# Patient Record
Sex: Female | Born: 1960 | Race: White | Hispanic: No | Marital: Married | Smoking: Never smoker
Health system: Southern US, Community
[De-identification: ages and names within clinical notes are randomized; demographics above are authoritative.]

## PROBLEM LIST (undated history)

## (undated) DIAGNOSIS — I10 Essential (primary) hypertension: Secondary | ICD-10-CM

## (undated) HISTORY — DX: Essential (primary) hypertension: I10

## (undated) HISTORY — PX: WISDOM TOOTH EXTRACTION: SHX21

---
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
## 1986-12-10 HISTORY — PX: TUBAL LIGATION: SHX77

## 2003-12-11 HISTORY — PX: LAPAROSCOPIC CHOLECYSTECTOMY: SUR755

## 2013-07-02 ENCOUNTER — Other Ambulatory Visit: Payer: Self-pay

## 2013-07-02 DIAGNOSIS — Z1231 Encounter for screening mammogram for malignant neoplasm of breast: Secondary | ICD-10-CM

## 2013-07-28 ENCOUNTER — Ambulatory Visit: Payer: Self-pay

## 2013-07-30 ENCOUNTER — Ambulatory Visit
Admission: RE | Admit: 2013-07-30 | Discharge: 2013-07-30 | Disposition: A | Discharge status: Home / Self Care | Payer: BC Managed Care – PPO | Source: Ambulatory Visit

## 2013-07-30 DIAGNOSIS — Z1231 Encounter for screening mammogram for malignant neoplasm of breast: Secondary | ICD-10-CM

## 2013-12-25 ENCOUNTER — Ambulatory Visit (AMBULATORY_SURGERY_CENTER): Payer: Self-pay

## 2013-12-25 VITALS — Ht 63.0 in | Wt 220.0 lb

## 2013-12-25 DIAGNOSIS — K219 Gastro-esophageal reflux disease without esophagitis: Secondary | ICD-10-CM

## 2013-12-25 DIAGNOSIS — Z1211 Encounter for screening for malignant neoplasm of colon: Secondary | ICD-10-CM

## 2013-12-25 MED ORDER — MOVIPREP 100 G PO SOLR: 1.0000 | Freq: Once | ORAL | Status: DC

## 2013-12-29 ENCOUNTER — Encounter: Payer: Self-pay | Admitting: Gastroenterology

## 2013-12-31 ENCOUNTER — Ambulatory Visit (AMBULATORY_SURGERY_CENTER): Payer: BC Managed Care – PPO | Admitting: Gastroenterology

## 2013-12-31 ENCOUNTER — Encounter: Payer: Self-pay | Admitting: Gastroenterology

## 2013-12-31 VITALS — BP 121/80 | HR 58 | Temp 97.4°F | Resp 20 | Ht 63.0 in | Wt 220.0 lb

## 2013-12-31 DIAGNOSIS — Z1211 Encounter for screening for malignant neoplasm of colon: Secondary | ICD-10-CM

## 2013-12-31 DIAGNOSIS — D131 Benign neoplasm of stomach: Secondary | ICD-10-CM

## 2013-12-31 DIAGNOSIS — D126 Benign neoplasm of colon, unspecified: Secondary | ICD-10-CM

## 2013-12-31 DIAGNOSIS — R1319 Other dysphagia: Secondary | ICD-10-CM

## 2013-12-31 DIAGNOSIS — K219 Gastro-esophageal reflux disease without esophagitis: Secondary | ICD-10-CM

## 2013-12-31 MED ORDER — SODIUM CHLORIDE 0.9 % IV SOLN: 500.0000 mL | INTRAVENOUS | Status: DC

## 2013-12-31 NOTE — Op Note (Signed)
[place]  [address] [place], [id number]   ENDOSCOPY PROCEDURE REPORT  PATIENT: Katie, Miles  MR#: [id number] BIRTHDATE: September 02, 1961 , 52  yrs. old GENDER: Female ENDOSCOPIST: Ladene Artist, MD, [facility] REFERRED BY:  Nelda Bucks, M.D. PROCEDURE DATE:  12/31/2013 PROCEDURE:  EGD w/ biopsy and Savary dilation of esophagus ASA CLASS:     Class II INDICATIONS:  History of esophageal reflux.   Dysphagia. MEDICATIONS: There was residual sedation effect present from prior procedure, MAC sedation, administered by CRNA, and propofol (Diprivan) 100mg  IV TOPICAL ANESTHETIC: none DESCRIPTION OF PROCEDURE: After the risks benefits and alternatives of the procedure were thoroughly explained, informed consent was obtained.  The LB [id number] endoscope was introduced through the mouth and advanced to the second portion of the duodenum. Without limitations.  The instrument was slowly withdrawn as the mucosa was fully examined.  ESOPHAGUS: The mucosa of the esophagus appeared normal. STOMACH: Five sessile polyps ranging between 3-54mm in size were found in the gastric body and gastric fundus.  Multiple biopsies was performed of the 3 larger polyps.   The stomach otherwise appeared normal. DUODENUM: The duodenal mucosa showed no abnormalities in the bulb and second portion of the duodenum.  Retroflexed views revealed no abnormalities.   A guidewire as placed and the scope was then withdrawn from the patient. 17 mm Savary dilator passed without resistance or heme. The dilator and guidewire were removed and the procedure completed.  COMPLICATIONS: There were no complications.  ENDOSCOPIC IMPRESSION: 1.   Five sessile polyps ranging between 3-5 mm in the gastric body and gastric fundus; biopsies 2.   The EGD otherwise appeared normal  RECOMMENDATIONS: 1.  Anti-reflux regimen 2.  Await pathology results 3.  Post dilation instructions 4.  Continue  PPI  eSigned:  Ladene Artist, MD, [facility] 12/31/2013 2:08 PM

## 2013-12-31 NOTE — Progress Notes (Signed)
Patient did not experience any of the following events: a burn prior to discharge; a fall within the facility; wrong site/side/patient/procedure/implant event; or a hospital transfer or hospital admission upon discharge from the facility. (G8907)Patient did not have preoperative order for IV antibiotic SSI prophylaxis. (G8918) ewm 

## 2013-12-31 NOTE — Progress Notes (Signed)
Lidocaine-40mg IV prior to Propofol InductionPropofol given over incremental dosages 

## 2013-12-31 NOTE — Progress Notes (Signed)
Called to room to assist during endoscopic procedure.  Patient ID and intended procedure confirmed with present staff. Received instructions for my participation in the procedure from the performing physician.  

## 2013-12-31 NOTE — Patient Instructions (Signed)
YOU HAD AN ENDOSCOPIC PROCEDURE TODAY AT [facility]: Refer to the procedure report that was given to you for any specific questions about what was found during the examination.  If the procedure report does not answer your questions, please call your gastroenterologist to clarify.  If you requested that your care partner not be given the details of your procedure findings, then the procedure report has been included in a sealed envelope for you to review at your convenience later.  YOU SHOULD EXPECT: Some feelings of bloating in the abdomen. Passage of more gas than usual.  Walking can help get rid of the air that was put into your GI tract during the procedure and reduce the bloating. If you had a lower endoscopy (such as a colonoscopy or flexible sigmoidoscopy) you may notice spotting of blood in your stool or on the toilet paper. If you underwent a bowel prep for your procedure, then you may not have a normal bowel movement for a few days.  Diet. .  Drink plenty of fluids but you should avoid alcoholic beverages for 24 hours. Please follow the dilation diet today as directed. You may resume your regular diet tomorrow morning Friday 01-01-2014.   ACTIVITY: Your care partner should take you home directly after the procedure.  You should plan to take it easy, moving slowly for the rest of the day.  You can resume normal activity the day after the procedure however you should NOT DRIVE or use heavy machinery for 24 hours (because of the sedation medicines used during the test).    SYMPTOMS TO REPORT IMMEDIATELY: A gastroenterologist can be reached at any hour.  During normal business hours, 8:30 AM to 5:00 PM Monday through Friday, call [phone].  After hours and on weekends, please call the GI answering service at [phone] who will take a message and have the physician on call contact you.   Following lower endoscopy (colonoscopy or flexible sigmoidoscopy):  Excessive  amounts of blood in the stool  Significant tenderness or worsening of abdominal pains  Swelling of the abdomen that is new, acute  Fever of 100F or higher  Following upper endoscopy (EGD)  Vomiting of blood or coffee ground material  New chest pain or pain under the shoulder blades  Painful or persistently difficult swallowing  New shortness of breath  Fever of 100F or higher  Black, tarry-looking stools  FOLLOW UP: If any biopsies were taken you will be contacted by phone or by letter within the next 1-3 weeks.  Call your gastroenterologist if you have not heard about the biopsies in 3 weeks.  Our staff will call the home number listed on your records the next business day following your procedure to check on you and address any questions or concerns that you may have at that time regarding the information given to you following your procedure. This is a courtesy call and so if there is no answer at the home number and we have not heard from you through the emergency physician on call, we will assume that you have returned to your regular daily activities without incident.  SIGNATURES/CONFIDENTIALITY: You and/or your care partner have signed paperwork which will be entered into your electronic medical record.  These signatures attest to the fact that that the information above on your After Visit Summary has been reviewed and is understood.  Full responsibility of the confidentiality of this discharge information lies with you and/or your care-partner.  Handout on  polyps, dilation diet ,reflux,hemorrhoids Continue your PPI daily as directed. Please hold aspirin, all aspirin products and all anti inflammatory medicines like motrin, aleve, advil, goody's for 2 weeks. You may use tylenol only for pain the next 2 weeks. You may resume the other medicines 01-14-2014 Thursday.

## 2013-12-31 NOTE — Op Note (Signed)
[place]  [address] [place], [id number]   COLONOSCOPY PROCEDURE REPORT  PATIENT: Katie Miles, Katie Miles  MR#: [id number] BIRTHDATE: January 06, 1961 , 52  yrs. old GENDER: Female ENDOSCOPIST: Ladene Artist, MD, [facility] REFERRED [id number] Delena Bali, M.D. PROCEDURE DATE:  12/31/2013 PROCEDURE:   Colonoscopy with snare polypectomy First Screening Colonoscopy - Avg.  risk and is 50 yrs.  old or older Yes.  Prior Negative Screening - Now for repeat screening. N/A  History of Adenoma - Now for follow-up colonoscopy & has been > or = to 3 yrs.  N/A  Polyps Removed Today? Yes. ASA CLASS:   Class II INDICATIONS:average risk screening. MEDICATIONS: MAC sedation, administered by CRNA and propofol (Diprivan) 350mg  IV DESCRIPTION OF PROCEDURE:   After the risks benefits and alternatives of the procedure were thoroughly explained, informed consent was obtained.  A digital rectal exam revealed no abnormalities of the rectum.   The LB [id number]  endoscope was introduced through the anus and advanced to the cecum, which was identified by both the appendix and ileocecal valve. No adverse events experienced.   The quality of the prep was good, using MoviPrep  The instrument was then slowly withdrawn as the colon was fully examined.  COLON FINDINGS: A sessile polyp measuring 5 mm in size was found in the ascending colon.  A polypectomy was performed with a cold snare.  The resection was complete and the polyp tissue was completely retrieved.  A sessile polyp measuring 5 mm in size was found in the proximal transverse colon. A polypectomy was performed with a cold snare. The resection was complete and the polyp tissue was completely retrieved.  A sessile polyp measuring 7 mm in size was found in the distal transverse colon.  A polypectomy was performed using snare cautery. The resection was complete and the polyp tissue was completely retrieved.  A sessile polyp measuring 5 mm in  size was found in the descending colon.  A polypectomy was performed with a cold snare.  The resection was complete and the polyp tissue was completely retrieved.  A sessile polyp measuring 5 mm in size was found in the sigmoid colon.  A polypectomy was performed with a cold snare. The resection was complete and the polyp tissue was completely retrieved.  The colon was otherwise normal. There was no diverticulosis, inflammation, polyps or cancers unless previously stated. Retroflexed views revealed small internal/external hemorrhoids. The time to cecum=2 minutes 30 seconds.  Withdrawal time=19 minutes 00 seconds.  The scope was withdrawn and the procedure completed. COMPLICATIONS: There were no complications.  ENDOSCOPIC IMPRESSION: 1.   Sessile polyp measuring 5 mm in the ascending colon; polypectomy performed with a cold snare 2.   Sessile polyp measuring 5 mm in the proximal transverse colon; polypectomy performed with a cold snare 3.   Sessile polyp measuring 7 mm in the distal transverse colon; polypectomy performed using snare cautery 4.   Sessile polyp measuring 5 mm in the descending colon; polypectomy performed with a cold snare 5.   Sessile polyp measuring 5 mm in the sigmoid colon; polypectomy performed with a cold snare 6.   Small internal/external hemorrhoids  RECOMMENDATIONS: 1.  Hold aspirin, aspirin products, and anti-inflammatory medication for 2 weeks. 2.  Await pathology results 3.  Repeat colonoscopy in 3 years if 3 or more polyps adenomatous; 5 years if 1-2 adenomatous: otherwise 10 years   eSigned:  Ladene Artist, MD, [facility] 12/31/2013 2:04 PM     PATIENT NAME:  Katie Miles, Katie Miles MR#: [id number]

## 2014-01-01 ENCOUNTER — Telehealth: Payer: Self-pay | Admitting: *Deleted

## 2014-01-01 NOTE — Telephone Encounter (Signed)
  Follow up Call-  Call back number 12/31/2013  Post procedure Call Back phone  # [phone]  Permission to leave phone message Yes     Patient questions:  Do you have a fever, pain , or abdominal swelling? no Pain Score  0 *  Have you tolerated food without any problems? yes  Have you been able to return to your normal activities? yes  Do you have any questions about your discharge instructions: Diet   no Medications  no Follow up visit  no  Do you have questions or concerns about your Care? no  Actions: * If pain score is 4 or above: No action needed, pain <4.

## 2014-01-05 ENCOUNTER — Encounter: Payer: Self-pay | Admitting: Gastroenterology

## 2014-01-13 NOTE — Addendum Note (Signed)
Addended by: Lowry Ram on: 01/13/2014 11:00 AM   Modules accepted: Level of Service

## 2014-09-09 ENCOUNTER — Other Ambulatory Visit: Payer: Self-pay

## 2014-09-09 DIAGNOSIS — Z1231 Encounter for screening mammogram for malignant neoplasm of breast: Secondary | ICD-10-CM

## 2014-09-23 ENCOUNTER — Ambulatory Visit
Admission: RE | Admit: 2014-09-23 | Discharge: 2014-09-23 | Disposition: A | Discharge status: Home / Self Care | Payer: BC Managed Care – PPO | Source: Ambulatory Visit

## 2014-09-23 DIAGNOSIS — Z1231 Encounter for screening mammogram for malignant neoplasm of breast: Secondary | ICD-10-CM

## 2015-11-24 ENCOUNTER — Other Ambulatory Visit: Payer: Self-pay

## 2015-11-24 DIAGNOSIS — Z1231 Encounter for screening mammogram for malignant neoplasm of breast: Secondary | ICD-10-CM

## 2015-12-07 ENCOUNTER — Ambulatory Visit
Admission: RE | Admit: 2015-12-07 | Discharge: 2015-12-07 | Disposition: A | Discharge status: Home / Self Care | Payer: No Typology Code available for payment source | Source: Ambulatory Visit

## 2015-12-07 DIAGNOSIS — Z1231 Encounter for screening mammogram for malignant neoplasm of breast: Secondary | ICD-10-CM

## 2016-10-30 ENCOUNTER — Encounter: Payer: Self-pay | Admitting: Gastroenterology

## 2016-11-04 IMAGING — MG MM SCREENING BREAST TOMO BILATERAL
8 series · 8 of 24 positions shown · non-contrast
Comparison: Previous exam(s).

CLINICAL DATA: Screening.

EXAM:
DIGITAL SCREENING BILATERAL MAMMOGRAM WITH 3D TOMO WITH CAD

[R CC]
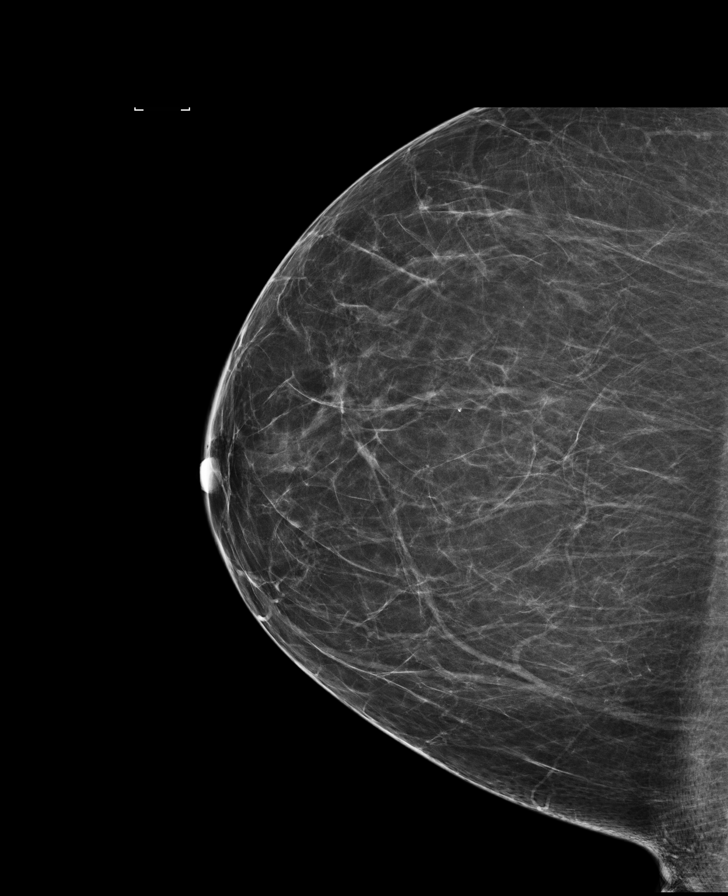

[R MLO]
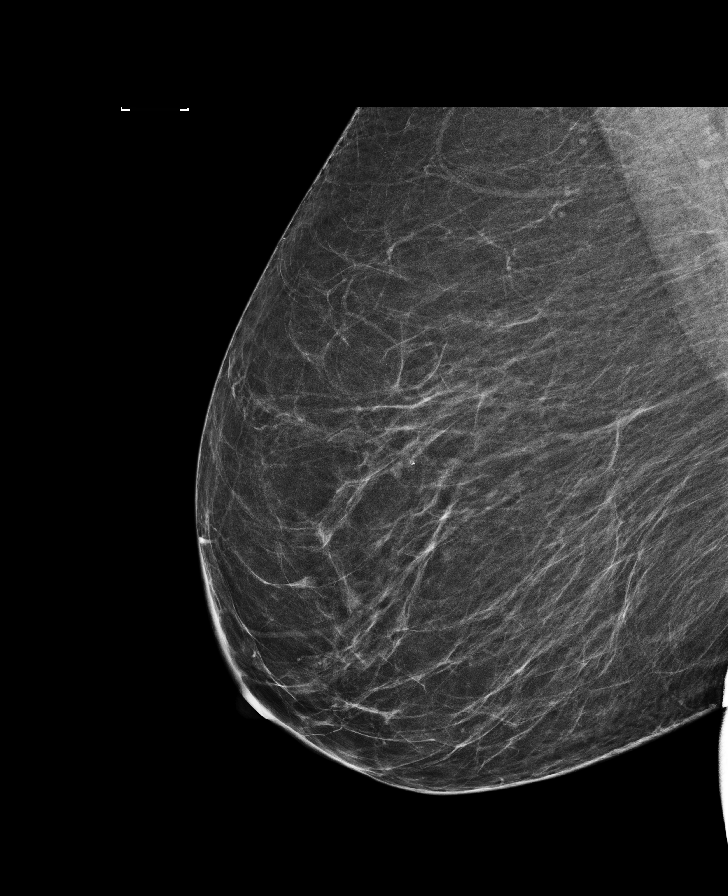

[L MLO]
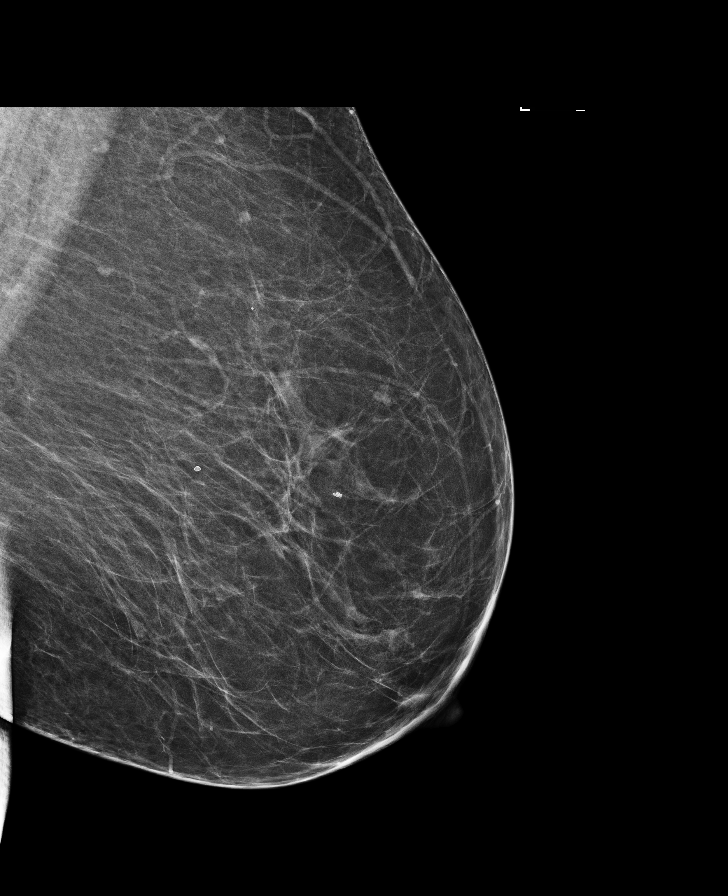

[L CC]
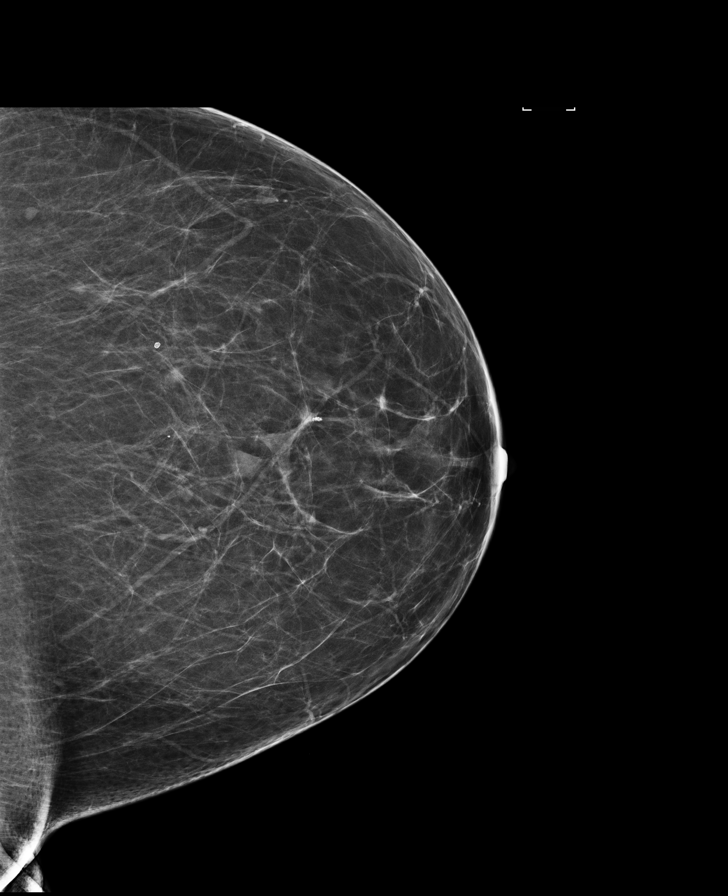

[R MLO tomo · tomo slice 40/79.0]
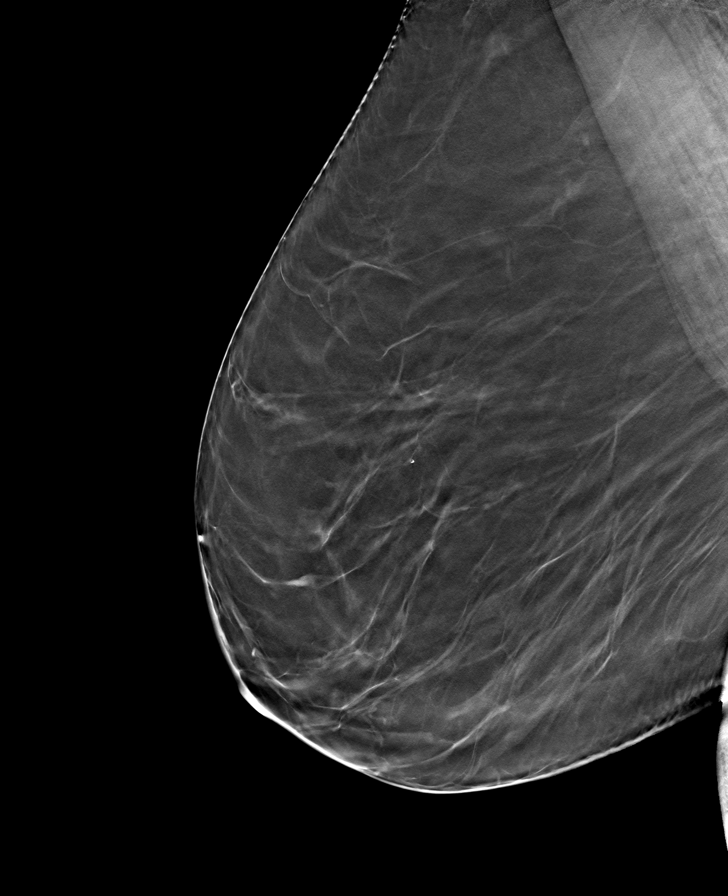

[L MLO tomo · tomo slice 41/81.0]
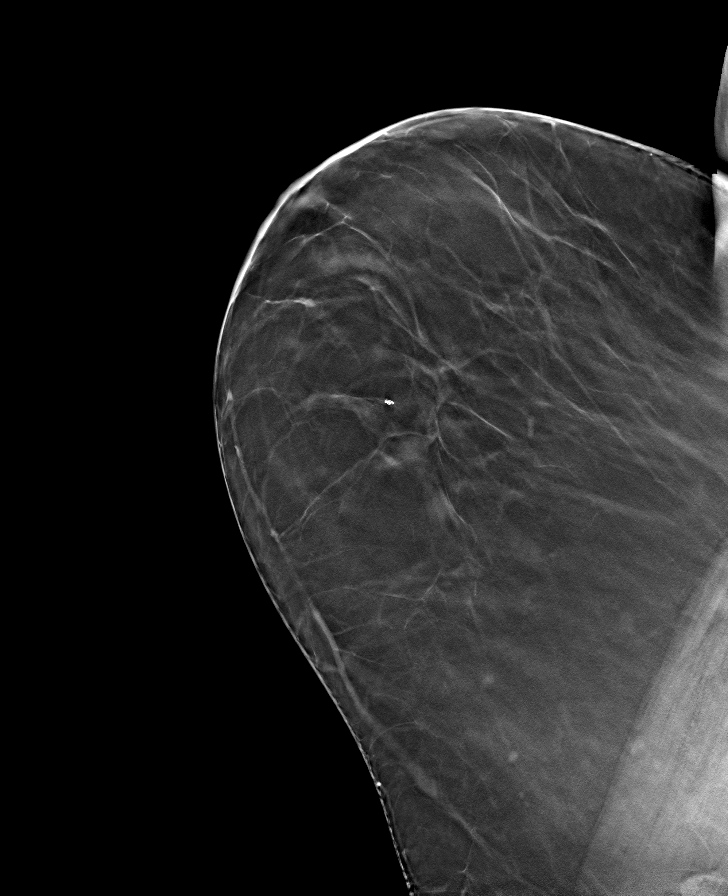

[R CC tomo · tomo slice 39/76.0]
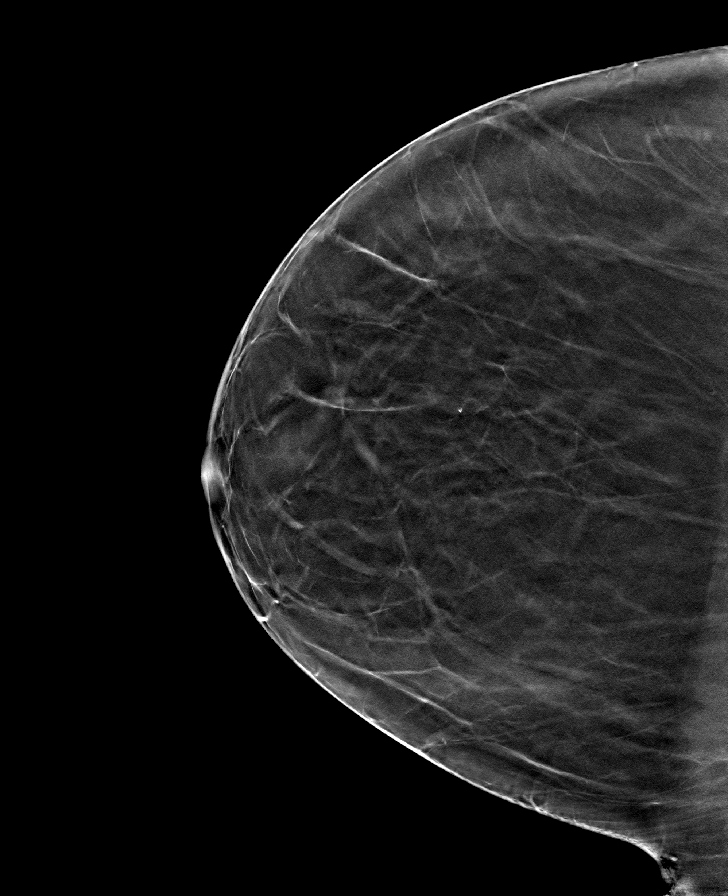

[L CC tomo · tomo slice 39/77.0]
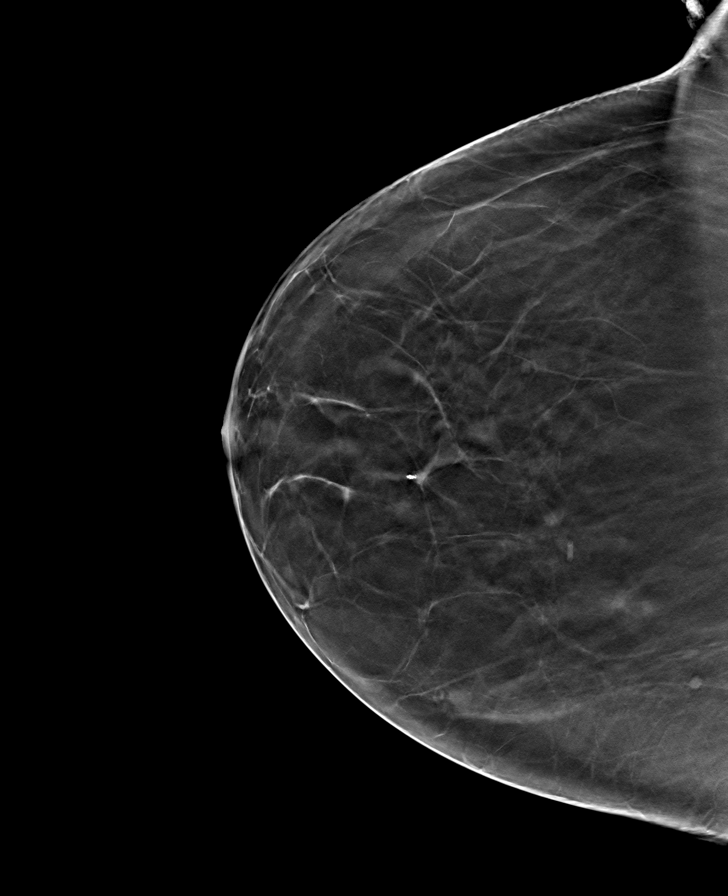

[8 of 24 positions shown; findings below may reference images not displayed]

ACR Breast Density Category b: There are scattered areas of
fibroglandular density.
FINDINGS: There are no findings suspicious for malignancy. Images were
processed with CAD.
IMPRESSION: No mammographic evidence of malignancy. A result letter of this
screening mammogram will be mailed directly to the patient.

RECOMMENDATION:
Screening mammogram in one year. (Code:55-L-23V)

BI-RADS CATEGORY  1: Negative.

## 2024-06-25 ENCOUNTER — Encounter: Payer: Self-pay | Admitting: Gastroenterology

## 2024-07-27 ENCOUNTER — Telehealth: Payer: Self-pay

## 2024-07-27 ENCOUNTER — Ambulatory Visit (AMBULATORY_SURGERY_CENTER)

## 2024-07-27 VITALS — Ht 63.0 in | Wt 210.0 lb

## 2024-07-27 DIAGNOSIS — Z860100 Personal history of colon polyps, unspecified: Secondary | ICD-10-CM

## 2024-07-27 DIAGNOSIS — R1310 Dysphagia, unspecified: Secondary | ICD-10-CM

## 2024-07-27 MED ORDER — NA SULFATE-K SULFATE-MG SULF 17.5-3.13-1.6 GM/177ML PO SOLN: 1.0000 | Freq: Once | ORAL | 0 refills | Status: AC

## 2024-07-27 NOTE — Telephone Encounter (Signed)
 Dr. Wilhelmenia,  This is a previous pt of Dr. Aneita. She wanted to see if it is possible to have an EGD the day of her colonoscopy. She has had EGD in the past with dilation and is reporting some difficulties with swallowing occasionally. Please let me know if you're ok to proceed with this.   Thank you

## 2024-07-27 NOTE — Telephone Encounter (Signed)
 This is very reasonable. EGD for issues of dysphagia with possible dilation. Thanks. GM

## 2024-07-27 NOTE — Addendum Note (Signed)
 Addended by: ANN DERRAL CROME on: 07/27/2024 09:05 AM   Modules accepted: Orders

## 2024-07-27 NOTE — Telephone Encounter (Signed)
 EGD added Ref has been updated pt made aware

## 2024-07-27 NOTE — Progress Notes (Signed)
 No egg or soy allergy known to patient  No issues known to pt with past sedation with any surgeries or procedures Patient denies ever being told they had issues or difficulty with intubation  No FH of Malignant Hyperthermia Pt is not on diet pills Pt is not on  home 02  Pt is not on blood thinners  Pt denies issues with constipation  No A fib or A flutter Have any cardiac testing pending-- no  LOA: independent  Prep: suprep   Patient's chart reviewed by Norleen Schillings CNRA prior to previsit and patient appropriate for the [facility].  Previsit completed and red dot placed by patient's name on their procedure day (on provider's schedule).     PV completed with patient. Prep instructions sent via to home address

## 2024-08-14 ENCOUNTER — Encounter: Admitting: Gastroenterology

## 2024-09-15 ENCOUNTER — Encounter: Payer: Self-pay | Admitting: Gastroenterology

## 2024-09-16 ENCOUNTER — Ambulatory Visit (AMBULATORY_SURGERY_CENTER): Admitting: Gastroenterology

## 2024-09-16 ENCOUNTER — Encounter: Payer: Self-pay | Admitting: Gastroenterology

## 2024-09-16 VITALS — BP 123/63 | HR 62 | Temp 97.5°F | Resp 17 | Ht 63.0 in | Wt 210.0 lb

## 2024-09-16 DIAGNOSIS — K5730 Diverticulosis of large intestine without perforation or abscess without bleeding: Secondary | ICD-10-CM | POA: Diagnosis not present

## 2024-09-16 DIAGNOSIS — Z860101 Personal history of adenomatous and serrated colon polyps: Secondary | ICD-10-CM | POA: Diagnosis not present

## 2024-09-16 DIAGNOSIS — D120 Benign neoplasm of cecum: Secondary | ICD-10-CM

## 2024-09-16 DIAGNOSIS — Z1211 Encounter for screening for malignant neoplasm of colon: Secondary | ICD-10-CM | POA: Diagnosis present

## 2024-09-16 DIAGNOSIS — K641 Second degree hemorrhoids: Secondary | ICD-10-CM | POA: Diagnosis not present

## 2024-09-16 DIAGNOSIS — Z860100 Personal history of colon polyps, unspecified: Secondary | ICD-10-CM

## 2024-09-16 DIAGNOSIS — K644 Residual hemorrhoidal skin tags: Secondary | ICD-10-CM

## 2024-09-16 DIAGNOSIS — K2970 Gastritis, unspecified, without bleeding: Secondary | ICD-10-CM

## 2024-09-16 DIAGNOSIS — K317 Polyp of stomach and duodenum: Secondary | ICD-10-CM

## 2024-09-16 DIAGNOSIS — D122 Benign neoplasm of ascending colon: Secondary | ICD-10-CM | POA: Diagnosis not present

## 2024-09-16 DIAGNOSIS — K449 Diaphragmatic hernia without obstruction or gangrene: Secondary | ICD-10-CM | POA: Diagnosis not present

## 2024-09-16 DIAGNOSIS — R1310 Dysphagia, unspecified: Secondary | ICD-10-CM | POA: Diagnosis not present

## 2024-09-16 MED ORDER — SODIUM CHLORIDE 0.9 % IV SOLN: 500.0000 mL | Freq: Once | INTRAVENOUS | Status: DC

## 2024-09-16 MED ORDER — SUCRALFATE 1 GM/10ML PO SUSP: 1.0000 g | Freq: Two times a day (BID) | ORAL | 0 refills | Status: AC

## 2024-09-16 NOTE — Op Note (Signed)
  [facility] Patient Name: Katie Miles Procedure Date: 09/16/2024 7:29 AM MRN: [id number] Endoscopist: [name] , MD, [id number] Age: 63 Referring MD:  Date of Birth: October 24, 1961 Gender: Female Account #: [id number] Procedure:                Upper GI endoscopy Indications:              Dysphagia, Heartburn Medicines:                Monitored Anesthesia Care Procedure:                Pre-Anesthesia Assessment:                           - Prior to the procedure, a History and Physical                            was performed, and patient medications and                            allergies were reviewed. The patient's tolerance of                            previous anesthesia was also reviewed. The risks                            and benefits of the procedure and the sedation                            options and risks were discussed with the patient.                            All questions were answered, and informed consent                            was obtained. Prior Anticoagulants: The patient has                            taken no anticoagulant or antiplatelet agents. ASA                            Grade Assessment: II - A patient with mild systemic                            disease. After reviewing the risks and benefits,                            the patient was deemed in satisfactory condition to                            undergo the procedure.                           After obtaining informed consent, the endoscope was  passed under direct vision. Throughout the                            procedure, the patient's blood pressure, pulse, and                            oxygen saturations were monitored continuously. The                            GIF [id number] #[id number] was introduced through the                            mouth, and advanced to the second part of duodenum.                            The upper GI endoscopy  was accomplished without                            difficulty. The patient tolerated the procedure. Scope In: Scope Out: Findings:                 No gross lesions were noted in the entire                            esophagus. Biopsies were taken with a cold forceps                            for histology. After the rest of the EGD was                            completed, a guidewire was placed and the scope was                            withdrawn. Dilation was performed with a Savary                            dilator with mild resistance at 18 mm. The dilation                            site was examined following endoscope reinsertion                            and showed no change.                           The Z-line was regular and was found 34 cm from the                            incisors.                           A 2 cm hiatal hernia was present.  Multiple small semi-sessile polyps with no bleeding                            and no stigmata of recent bleeding were found in                            the cardia, in the gastric fundus and in the                            gastric body (fundic gland and hyperplastic in                            appearance).                           One 13 mm semi-sessile polyp with bleeding and                            stigmata of recent bleeding was found in the                            gastric antrum. The polyp was removed with a cold                            snare. Resection and retrieval were complete.                           Patchy moderate inflammation characterized by                            erosions and erythema was found in the entire                            examined stomach. Biopsies were taken with a cold                            forceps for histology and Helicobacter pylori                            testing.                           No gross lesions were noted in the duodenal bulb,                             in the first portion of the duodenum and in the                            second portion of the duodenum. Complications:            No immediate complications. Estimated Blood Loss:     Estimated blood loss was minimal. Impression:               - No gross lesions in the entire esophagus.  Biopsied. Dilated to 18 mm Savary.                           - Z-line regular, 34 cm from the incisors.                           - 2 cm hiatal hernia.                           - Multiple gastric polyps - fundic gland and                            hyperplastic in appearance in proximal stomach.                           - One gastric polyp in antrum. Resected and                            retrieved.                           - Erosive gastritis. Biopsied.                           - No gross lesions in the duodenal bulb, in the                            first portion of the duodenum and in the second                            portion of the duodenum. Recommendation:           - Proceed to scheduled colonoscopy.                           - Continue PPI BID.                           - Carafate 1 g twice daily for 2-weeks.                           - Minimize NSAIDs if using.                           - Continue present medications.                           - Await pathology results.                           - Repeat upper endoscopy for surveillance based on                            pathology results (if pathology shows adenomatous                            tissue then  will need 1-year followup).                           - If dysphagia symptoms persist consider esophageal                            manometry.                           - The findings and recommendations were discussed                            with the patient.                           - The findings and recommendations were discussed                            with the  patient's family. [name], MD 09/16/2024 8:50:19 AM

## 2024-09-16 NOTE — Progress Notes (Signed)
Pt. states no medical or surgical changes since previsit or office visit. 

## 2024-09-16 NOTE — Patient Instructions (Addendum)
 Educational handout provided to patient related to Hemorrhoids, Polyps, and Diverticulosis, high fiber diet  High Fiber  diet  Continue present medications  Awaiting pathology results  YOU HAD AN ENDOSCOPIC PROCEDURE TODAY AT THE [facility]:   Refer to the procedure report that was given to you for any specific questions about what was found during the examination.  If the procedure report does not answer your questions, please call your gastroenterologist to clarify.  If you requested that your care partner not be given the details of your procedure findings, then the procedure report has been included in a sealed envelope for you to review at your convenience later.  YOU SHOULD EXPECT: Some feelings of bloating in the abdomen. Passage of more gas than usual.  Walking can help get rid of the air that was put into your GI tract during the procedure and reduce the bloating. If you had a lower endoscopy (such as a colonoscopy or flexible sigmoidoscopy) you may notice spotting of blood in your stool or on the toilet paper. If you underwent a bowel prep for your procedure, you may not have a normal bowel movement for a few days.  Please Note:  You might notice some irritation and congestion in your nose or some drainage.  This is from the oxygen used during your procedure.  There is no need for concern and it should clear up in a day or so.  SYMPTOMS TO REPORT IMMEDIATELY:  Following lower endoscopy (colonoscopy or flexible sigmoidoscopy):  Excessive amounts of blood in the stool  Significant tenderness or worsening of abdominal pains  Swelling of the abdomen that is new, acute  Fever of 100F or higher  Following upper endoscopy (EGD)  Vomiting of blood or coffee ground material  New chest pain or pain under the shoulder blades  Painful or persistently difficult swallowing  New shortness of breath  Fever of 100F or higher  Black, tarry-looking stools  For urgent or emergent  issues, a gastroenterologist can be reached at any hour by calling [phone]. Do not use MyChart messaging for urgent concerns.    DIET:  We do recommend a small meal at first, but then you may proceed to your regular diet.  Drink plenty of fluids but you should avoid alcoholic beverages for 24 hours.  ACTIVITY:  You should plan to take it easy for the rest of today and you should NOT DRIVE or use heavy machinery until tomorrow (because of the sedation medicines used during the test).    FOLLOW UP: Our staff will call the number listed on your records the next business day following your procedure.  We will call around 7:15- 8:00 am to check on you and address any questions or concerns that you may have regarding the information given to you following your procedure. If we do not reach you, we will leave a message.     If any biopsies were taken you will be contacted by phone or by letter within the next 1-3 weeks.  Please call us  at [phone] if you have not heard about the biopsies in 3 weeks.    SIGNATURES/CONFIDENTIALITY: You and/or your care partner have signed paperwork which will be entered into your electronic medical record.  These signatures attest to the fact that that the information above on your After Visit Summary has been reviewed and is understood.  Full responsibility of the confidentiality of this discharge information lies with you and/or your care-partner.

## 2024-09-16 NOTE — Progress Notes (Signed)
 GASTROENTEROLOGY PROCEDURE H&P NOTE   Primary Care Physician: Keren Vicenta BRAVO, MD  HPI: Katie Miles is a 63 y.o. female who presents for EGD/Colonoscopy for evaluation of dysphagia and prior colon polyps.  Past Medical History:  Diagnosis Date   Hypertension    Past Surgical History:  Procedure Laterality Date   KNEE ARTHROSCOPY  1979   LAPAROSCOPIC CHOLECYSTECTOMY  2005   TUBAL LIGATION  1988   WISDOM TOOTH EXTRACTION     Current Outpatient Medications  Medication Sig Dispense Refill   escitalopram (LEXAPRO) 10 MG tablet Take 10 mg by mouth daily.     esomeprazole (NEXIUM) 40 MG capsule Take 40 mg by mouth daily at 12 noon. (Patient taking differently: Take 40 mg by mouth 2 (two) times daily.)     lisinopril-hydrochlorothiazide (ZESTORETIC) 20-25 MG tablet Take 1 tablet by mouth daily.     EPINEPHrine 0.3 mg/0.3 mL IJ SOAJ injection Inject into the muscle as directed.     Inulin (FIBER CHOICE PO) Take 2 tablets by mouth.     Current Facility-Administered Medications  Medication Dose Route Frequency Provider Last Rate Last Admin   0.9 %  sodium chloride  infusion  500 mL Intravenous Once Mansouraty, Alexzandria Massman Jr., MD        Current Outpatient Medications:    escitalopram (LEXAPRO) 10 MG tablet, Take 10 mg by mouth daily., Disp: , Rfl:    esomeprazole (NEXIUM) 40 MG capsule, Take 40 mg by mouth daily at 12 noon. (Patient taking differently: Take 40 mg by mouth 2 (two) times daily.), Disp: , Rfl:    lisinopril-hydrochlorothiazide (ZESTORETIC) 20-25 MG tablet, Take 1 tablet by mouth daily., Disp: , Rfl:    EPINEPHrine 0.3 mg/0.3 mL IJ SOAJ injection, Inject into the muscle as directed., Disp: , Rfl:    Inulin (FIBER CHOICE PO), Take 2 tablets by mouth., Disp: , Rfl:   Current Facility-Administered Medications:    0.9 %  sodium chloride  infusion, 500 mL, Intravenous, Once, Mansouraty, Aloha Raddle., MD Allergies  Allergen Reactions   Shellfish Allergy Shortness Of  Breath    Stated, makes me feel like I'm smothering   Fluoride Preparations Other (See Comments)    High doses cause tongue to crack   Keflex [Cephalexin] Hives   Family History  Problem Relation Age of Onset   Colon cancer Neg Hx    Pancreatic cancer Neg Hx    Rectal cancer Neg Hx    Stomach cancer Neg Hx    Esophageal cancer Neg Hx    Social History   Socioeconomic History   Marital status: Married    Spouse name: Not on file   Number of children: Not on file   Years of education: Not on file   Highest education level: Not on file  Occupational History   Not on file  Tobacco Use   Smoking status: Never   Smokeless tobacco: Never  Substance and Sexual Activity   Alcohol use: No   Drug use: No   Sexual activity: Not on file  Other Topics Concern   Not on file  Social History Narrative   Not on file   Social Drivers of Health   Financial Resource Strain: Not on file  Food Insecurity: Not on file  Transportation Needs: Not on file  Physical Activity: Not on file  Stress: Not on file  Social Connections: Not on file  Intimate Partner Violence: Not on file    Physical Exam: Today's Vitals   09/16/24 9281  BP: 107/70  Pulse: 70  Temp: (!) 97.5 F (36.4 C)  TempSrc: Skin  SpO2: 97%  Weight: 210 lb (95.3 kg)  Height: 5' 3 (1.6 m)   Body mass index is 37.2 kg/m. GEN: NAD EYE: Sclerae anicteric ENT: MMM CV: Non-tachycardic GI: Soft, NT/ND NEURO:  Alert & Oriented x 3  Lab Results: No results for input(s): WBC, HGB, HCT, PLT in the last 72 hours. BMET No results for input(s): NA, K, CL, CO2, GLUCOSE, BUN, CREATININE, CALCIUM in the last 72 hours. LFT No results for input(s): PROT, ALBUMIN, AST, ALT, ALKPHOS, BILITOT, BILIDIR, IBILI in the last 72 hours. PT/INR No results for input(s): LABPROT, INR in the last 72 hours.   Impression / Plan: This is a 63 y.o.female who presents for EGD/Colonoscopy for  evaluation of dysphagia and prior colon polyps.  The risks and benefits of endoscopic evaluation/treatment were discussed with the patient and/or family; these include but are not limited to the risk of perforation, infection, bleeding, missed lesions, lack of diagnosis, severe illness requiring hospitalization, as well as anesthesia and sedation related illnesses.  The patient's history has been reviewed, patient examined, no change in status, and deemed stable for procedure.  The patient and/or family is agreeable to proceed.    [name], MD [facility] Office # [phone]

## 2024-09-16 NOTE — Progress Notes (Signed)
 Called to room to assist during endoscopic procedure.  Patient ID and intended procedure confirmed with present staff. Received instructions for my participation in the procedure from the performing physician.

## 2024-09-16 NOTE — Op Note (Signed)
 [facility] Patient Name: Katie Miles Procedure Date: 09/16/2024 7:28 AM MRN: [id number] Endoscopist: [name] , MD, [id number] Age: 63 Referring MD:  Date of Birth: 11-10-1961 Gender: Female Account #: [id number] Procedure:                Colonoscopy Indications:              High risk colon cancer surveillance: Personal                            history of non-advanced adenoma Medicines:                Monitored Anesthesia Care Procedure:                Pre-Anesthesia Assessment:                           - Prior to the procedure, a History and Physical                            was performed, and patient medications and                            allergies were reviewed. The patient's tolerance of                            previous anesthesia was also reviewed. The risks                            and benefits of the procedure and the sedation                            options and risks were discussed with the patient.                            All questions were answered, and informed consent                            was obtained. Prior Anticoagulants: The patient has                            taken no anticoagulant or antiplatelet agents. ASA                            Grade Assessment: II - A patient with mild systemic                            disease. After reviewing the risks and benefits,                            the patient was deemed in satisfactory condition to                            undergo the procedure.  After obtaining informed consent, the colonoscope                            was passed under direct vision. Throughout the                            procedure, the patient's blood pressure, pulse, and                            oxygen saturations were monitored continuously. The                            Olympus Scope SN: [id number] was introduced through                            the anus and advanced  to the 3 cm into the ileum.                            The colonoscopy was performed without difficulty.                            The patient tolerated the procedure. The quality of                            the bowel preparation was adequate. The terminal                            ileum, ileocecal valve, appendiceal orifice, and                            rectum were photographed. Scope In: 8:27:56 AM Scope Out: 8:42:35 AM Scope Withdrawal Time: 0 hours 11 minutes 40 seconds  Total Procedure Duration: 0 hours 14 minutes 39 seconds  Findings:                 Skin tags were found on perianal exam.                           The digital rectal exam findings include                            hemorrhoids. Pertinent negatives include no                            palpable rectal lesions.                           The terminal ileum and ileocecal valve appeared                            normal.                           Four sessile polyps were found in the ascending  colon (2) and cecum (2). The polyps were 2 to 5 mm                            in size. These polyps were removed with a cold                            snare. Resection and retrieval were complete.                           Multiple small-mouthed diverticula were found in                            the recto-sigmoid colon and sigmoid colon.                           Non-bleeding non-thrombosed internal external and                            internal hemorrhoids were found during                            retroflexion, during perianal exam and during                            digital exam. The hemorrhoids were Grade II                            (internal hemorrhoids that prolapse but reduce                            spontaneously).                           Normal mucosa was found in the entire colon                            otherwise. Complications:            No immediate  complications. Estimated Blood Loss:     Estimated blood loss was minimal. Impression:               - Perianal skin tags found on perianal exam.                            Hemorrhoids found on digital rectal exam.                           - The examined portion of the ileum was normal.                           - Four 2 to 5 mm polyps in the ascending colon and                            in the cecum, removed with a cold snare. Resected  and retrieved.                           - Diverticulosis in the recto-sigmoid colon and in                            the sigmoid colon.                           - Normal mucosa in the entire examined colon                            otherwise.                           - Non-bleeding non-thrombosed internal external and                            internal hemorrhoids. Recommendation:           - The patient will be observed post-procedure,                            until all discharge criteria are met.                           - Discharge patient to home.                           - Patient has a contact number available for                            emergencies. The signs and symptoms of potential                            delayed complications were discussed with the                            patient. Return to normal activities tomorrow.                            Written discharge instructions were provided to the                            patient.                           - High fiber diet.                           - Use FiberCon 1-2 tablets PO daily.                           - Continue present medications.                           - Await pathology results.                           -  Repeat colonoscopy in 3/5/7 years for                            surveillance based on pathology results.                           - The findings and recommendations were discussed                            with the  patient.                           - The findings and recommendations were discussed                            with the patient's family. [name], MD 09/16/2024 8:53:50 AM

## 2024-09-17 ENCOUNTER — Telehealth: Payer: Self-pay

## 2024-09-17 NOTE — Telephone Encounter (Signed)
  Follow up Call-     09/16/2024    7:20 AM  Call back number  Post procedure Call Back phone  # [phone]  Permission to leave phone message Yes     Patient questions:  Do you have a fever, pain , or abdominal swelling? No. Pain Score  0 *  Have you tolerated food without any problems? Yes.    Have you been able to return to your normal activities? Yes.    Do you have any questions about your discharge instructions: Diet   No. Medications  No. Follow up visit  No.  Do you have questions or concerns about your Care? No.  Actions: * If pain score is 4 or above: No action needed, pain <4.

## 2024-09-21 LAB — SURGICAL PATHOLOGY

## 2024-09-23 ENCOUNTER — Ambulatory Visit: Payer: Self-pay | Admitting: Gastroenterology
